# Patient Record
Sex: Male | Born: 1960 | Race: White | Hispanic: No | Marital: Married | Smoking: Never smoker
Health system: Southern US, Community
[De-identification: ages and names within clinical notes are randomized; demographics above are authoritative.]

## PROBLEM LIST (undated history)

## (undated) DIAGNOSIS — N138 Other obstructive and reflux uropathy: Secondary | ICD-10-CM

## (undated) DIAGNOSIS — E291 Testicular hypofunction: Secondary | ICD-10-CM

## (undated) DIAGNOSIS — N200 Calculus of kidney: Secondary | ICD-10-CM

## (undated) DIAGNOSIS — N401 Enlarged prostate with lower urinary tract symptoms: Secondary | ICD-10-CM

## (undated) HISTORY — DX: Other obstructive and reflux uropathy: N13.8

## (undated) HISTORY — DX: Other obstructive and reflux uropathy: N40.1

## (undated) HISTORY — PX: KNEE ARTHROSCOPY: SUR90

## (undated) HISTORY — DX: Testicular hypofunction: E29.1

## (undated) HISTORY — DX: Calculus of kidney: N20.0

---

## 1998-08-22 ENCOUNTER — Emergency Department (HOSPITAL_COMMUNITY): Admission: EM | Admit: 1998-08-22 | Discharge: 1998-08-22 | Payer: Self-pay | Admitting: Emergency Medicine

## 2000-02-18 ENCOUNTER — Encounter (INDEPENDENT_AMBULATORY_CARE_PROVIDER_SITE_OTHER): Payer: Self-pay | Admitting: Specialist

## 2000-02-18 ENCOUNTER — Ambulatory Visit (HOSPITAL_COMMUNITY): Admission: RE | Admit: 2000-02-18 | Discharge: 2000-02-18 | Payer: Self-pay | Admitting: *Deleted

## 2001-02-17 ENCOUNTER — Emergency Department (HOSPITAL_COMMUNITY): Admission: EM | Admit: 2001-02-17 | Discharge: 2001-02-17 | Payer: Self-pay | Admitting: Emergency Medicine

## 2001-02-26 ENCOUNTER — Emergency Department (HOSPITAL_COMMUNITY): Admission: EM | Admit: 2001-02-26 | Discharge: 2001-02-26 | Payer: Self-pay

## 2002-06-24 ENCOUNTER — Emergency Department (HOSPITAL_COMMUNITY): Admission: EM | Admit: 2002-06-24 | Discharge: 2002-06-24 | Payer: Self-pay | Admitting: *Deleted

## 2005-08-29 ENCOUNTER — Emergency Department (HOSPITAL_COMMUNITY): Admission: EM | Admit: 2005-08-29 | Discharge: 2005-08-29 | Payer: Self-pay | Admitting: Emergency Medicine

## 2005-09-08 ENCOUNTER — Ambulatory Visit (HOSPITAL_COMMUNITY): Admission: RE | Admit: 2005-09-08 | Discharge: 2005-09-08 | Payer: Self-pay | Admitting: Urology

## 2008-05-26 ENCOUNTER — Ambulatory Visit (HOSPITAL_COMMUNITY): Admission: RE | Admit: 2008-05-26 | Discharge: 2008-05-26 | Payer: Self-pay | Admitting: Urology

## 2011-09-15 LAB — CBC
MCV: 88.2
Platelets: 345
WBC: 6.4

## 2011-09-15 LAB — PROTIME-INR
INR: 1
Prothrombin Time: 12.9

## 2011-12-20 HISTORY — PX: COLONOSCOPY: SHX174

## 2012-02-20 ENCOUNTER — Encounter: Payer: Self-pay | Admitting: Family Medicine

## 2012-02-20 ENCOUNTER — Ambulatory Visit: Payer: Self-pay | Admitting: Family

## 2012-02-20 ENCOUNTER — Ambulatory Visit (INDEPENDENT_AMBULATORY_CARE_PROVIDER_SITE_OTHER): Payer: BC Managed Care – PPO | Admitting: Family Medicine

## 2012-02-20 VITALS — BP 144/88 | HR 80 | Temp 98.4°F | Wt 215.0 lb

## 2012-02-20 DIAGNOSIS — K5289 Other specified noninfective gastroenteritis and colitis: Secondary | ICD-10-CM

## 2012-02-20 DIAGNOSIS — K529 Noninfective gastroenteritis and colitis, unspecified: Secondary | ICD-10-CM

## 2012-02-20 MED ORDER — SILODOSIN 8 MG PO CAPS: 8.0000 mg | ORAL_CAPSULE | Freq: Every day | ORAL | Status: DC

## 2012-02-20 NOTE — Progress Notes (Signed)
  Subjective:    Patient ID: Calvin Turner, male    DOB: [birth year], 51 y.o.   MRN: [id number]  HPI Here for one week of vomiting and diarrhea. He seems to be over the worst of it now. He is able to eat and drink fluids now. He feels a little weak. No fever.   Review of Systems  Constitutional: Negative.   Respiratory: Negative.   Cardiovascular: Negative.   Gastrointestinal: Positive for vomiting and diarrhea. Negative for abdominal pain.       Objective:   Physical Exam  Constitutional: He appears well-developed and well-nourished.  Abdominal: Soft. Bowel sounds are normal. He exhibits no distension and no mass. There is no tenderness. There is no rebound and no guarding.          Assessment & Plan:  Rest, drink fluids.

## 2012-05-08 ENCOUNTER — Encounter: Payer: Self-pay | Admitting: Family Medicine

## 2012-05-08 ENCOUNTER — Ambulatory Visit (INDEPENDENT_AMBULATORY_CARE_PROVIDER_SITE_OTHER): Payer: BC Managed Care – PPO | Admitting: Family Medicine

## 2012-05-08 VITALS — BP 150/88 | HR 86 | Temp 98.6°F | Wt 222.0 lb

## 2012-05-08 DIAGNOSIS — L308 Other specified dermatitis: Secondary | ICD-10-CM

## 2012-05-08 DIAGNOSIS — L408 Other psoriasis: Secondary | ICD-10-CM

## 2012-05-08 MED ORDER — OMEPRAZOLE MAGNESIUM 20 MG PO TBEC: 20.0000 mg | DELAYED_RELEASE_TABLET | Freq: Every day | ORAL | Status: DC

## 2012-05-08 MED ORDER — HALOBETASOL PROPIONATE 0.05 % EX CREA: TOPICAL_CREAM | Freq: Three times a day (TID) | CUTANEOUS | Status: AC

## 2012-05-08 NOTE — Progress Notes (Signed)
  Subjective:    Patient ID: Calvin Turner, male    DOB: May 04, 1961, 51 y.o.   MRN: [id number]  HPI Here for 4 weeks of itchy spots on his arms and chest. They come and go, and he is not using anything on them.    Review of Systems  Constitutional: Negative.   Skin: Positive for rash.       Objective:   Physical Exam  Constitutional: He appears well-developed and well-nourished.  Skin:       Widespread small red scaly macular spots as above           Assessment & Plan:  Recheck prn. He will set up a cpx soon

## 2012-06-19 ENCOUNTER — Other Ambulatory Visit (INDEPENDENT_AMBULATORY_CARE_PROVIDER_SITE_OTHER): Payer: BC Managed Care – PPO

## 2012-06-19 DIAGNOSIS — Z0000 Encounter for general adult medical examination without abnormal findings: Secondary | ICD-10-CM

## 2012-06-19 LAB — CBC WITH DIFFERENTIAL/PLATELET
Basophils Relative: 0.5 % (ref 0.0–3.0)
Eosinophils Absolute: 0.3 10*3/uL (ref 0.0–0.7)
HCT: 39.3 % (ref 39.0–52.0)
Lymphs Abs: 3 10*3/uL (ref 0.7–4.0)
MCHC: 33.3 g/dL (ref 30.0–36.0)
MCV: 87 fl (ref 78.0–100.0)
Monocytes Absolute: 0.7 10*3/uL (ref 0.1–1.0)
Neutro Abs: 2.5 10*3/uL (ref 1.4–7.7)
Neutrophils Relative %: 37.7 % — ABNORMAL LOW (ref 43.0–77.0)
RBC: 4.52 Mil/uL (ref 4.22–5.81)

## 2012-06-19 LAB — HEPATIC FUNCTION PANEL
ALT: 24 U/L (ref 0–53)
Albumin: 3.8 g/dL (ref 3.5–5.2)
Bilirubin, Direct: 0 mg/dL (ref 0.0–0.3)
Total Protein: 7 g/dL (ref 6.0–8.3)

## 2012-06-19 LAB — LIPID PANEL
Cholesterol: 232 mg/dL — ABNORMAL HIGH (ref 0–200)
Triglycerides: 261 mg/dL — ABNORMAL HIGH (ref 0.0–149.0)

## 2012-06-19 LAB — BASIC METABOLIC PANEL
BUN: 15 mg/dL (ref 6–23)
Glucose, Bld: 98 mg/dL (ref 70–99)
Sodium: 137 mEq/L (ref 135–145)

## 2012-06-19 LAB — POCT URINALYSIS DIPSTICK
Bilirubin, UA: NEGATIVE
Blood, UA: NEGATIVE
Glucose, UA: NEGATIVE
Ketones, UA: NEGATIVE
Spec Grav, UA: 1.025

## 2012-06-19 LAB — LDL CHOLESTEROL, DIRECT: Direct LDL: 140.9 mg/dL

## 2012-06-19 LAB — TSH: TSH: 2.25 u[IU]/mL (ref 0.35–5.50)

## 2012-06-26 ENCOUNTER — Ambulatory Visit (INDEPENDENT_AMBULATORY_CARE_PROVIDER_SITE_OTHER): Payer: BC Managed Care – PPO | Admitting: Family Medicine

## 2012-06-26 ENCOUNTER — Encounter: Payer: Self-pay | Admitting: Family Medicine

## 2012-06-26 VITALS — BP 148/90 | HR 91 | Temp 98.1°F | Ht 74.0 in | Wt 222.0 lb

## 2012-06-26 DIAGNOSIS — Z0000 Encounter for general adult medical examination without abnormal findings: Secondary | ICD-10-CM

## 2012-06-26 MED ORDER — SILODOSIN 8 MG PO CAPS: 8.0000 mg | ORAL_CAPSULE | Freq: Every day | ORAL | Status: DC

## 2012-06-26 MED ORDER — LISINOPRIL 10 MG PO TABS: 10.0000 mg | ORAL_TABLET | Freq: Every day | ORAL | Status: DC

## 2012-06-26 MED ORDER — TEMAZEPAM 30 MG PO CAPS: 30.0000 mg | ORAL_CAPSULE | Freq: Every evening | ORAL | Status: DC | PRN

## 2012-06-26 NOTE — Progress Notes (Signed)
Quick Note:  Pt is here now for CPE and the doctor will discuss. ______

## 2012-06-26 NOTE — Progress Notes (Signed)
  Subjective:    Patient ID: Calvin Turner, male    DOB: March 14, 1961, 51 y.o.   MRN: [id number]  HPI 51 yr old male for a cpx. He feels well except for a few things. He fell off a porch recently and fractured his left ankle. He is wearing a Cam boot and seeing an Orthopedist in [place]. Also he frequently has trouble sleeping. He works third shift and his sleep has been disrupted for years.    Review of Systems  Constitutional: Negative.   HENT: Negative.   Eyes: Negative.   Respiratory: Negative.   Cardiovascular: Negative.   Gastrointestinal: Negative.   Genitourinary: Negative.   Musculoskeletal: Negative.   Skin: Negative.   Neurological: Negative.   Hematological: Negative.   Psychiatric/Behavioral: Positive for disturbed wake/sleep cycle. Negative for hallucinations, behavioral problems, confusion, dysphoric mood, decreased concentration and agitation. The patient is not nervous/anxious and is not hyperactive.        Objective:   Physical Exam  Constitutional: He is oriented to person, place, and time. He appears well-developed and well-nourished. No distress.  HENT:  Head: Normocephalic and atraumatic.  Right Ear: External ear normal.  Left Ear: External ear normal.  Nose: Nose normal.  Mouth/Throat: Oropharynx is clear and moist. No oropharyngeal exudate.  Eyes: Conjunctivae and EOM are normal. Pupils are equal, round, and reactive to light. Right eye exhibits no discharge. Left eye exhibits no discharge. No scleral icterus.  Neck: Neck supple. No JVD present. No tracheal deviation present. No thyromegaly present.  Cardiovascular: Normal rate, regular rhythm, normal heart sounds and intact distal pulses.  Exam reveals no gallop and no friction rub.   No murmur heard.      EKG normal   Pulmonary/Chest: Effort normal and breath sounds normal. No respiratory distress. He has no wheezes. He has no rales. He exhibits no tenderness.  Abdominal: Soft. Bowel sounds are normal.  He exhibits no distension and no mass. There is no tenderness. There is no rebound and no guarding.  Genitourinary: Rectum normal, prostate normal and penis normal. Guaiac negative stool. No penile tenderness.  Musculoskeletal: Normal range of motion. He exhibits no edema and no tenderness.  Lymphadenopathy:    He has no cervical adenopathy.  Neurological: He is alert and oriented to person, place, and time. He has normal reflexes. No cranial nerve deficit. He exhibits normal muscle tone. Coordination normal.  Skin: Skin is warm and dry. No rash noted. He is not diaphoretic. No erythema. No pallor.  Psychiatric: He has a normal mood and affect. His behavior is normal. Judgment and thought content normal.          Assessment & Plan:  Well exam. He needs to lose some weight. Start on Temazepam for sleep. Try Lisinopril for the HTN. Recheck in one month. He needs a colonoscopy, but he wants to check with his insurance company before we set this up.

## 2012-12-17 ENCOUNTER — Other Ambulatory Visit: Payer: Self-pay | Admitting: Family Medicine

## 2012-12-21 ENCOUNTER — Telehealth: Payer: Self-pay | Admitting: Family Medicine

## 2012-12-21 NOTE — Telephone Encounter (Signed)
Refill request for Temazepam 30 mg take 1 po qhs prn and last here on 06/26/12.

## 2012-12-21 NOTE — Telephone Encounter (Signed)
Call in #30 with 5 rf 

## 2012-12-24 ENCOUNTER — Other Ambulatory Visit: Payer: Self-pay | Admitting: Family Medicine

## 2012-12-24 NOTE — Telephone Encounter (Signed)
I refilled this on 12-17-12

## 2012-12-25 NOTE — Telephone Encounter (Signed)
This was called in

## 2013-05-19 ENCOUNTER — Other Ambulatory Visit: Payer: Self-pay | Admitting: Family Medicine

## 2013-06-19 ENCOUNTER — Other Ambulatory Visit: Payer: Self-pay | Admitting: Family Medicine

## 2013-06-23 ENCOUNTER — Other Ambulatory Visit: Payer: Self-pay | Admitting: Family Medicine

## 2013-06-26 NOTE — Telephone Encounter (Signed)
Call in #30 with 5 rf 

## 2013-08-30 ENCOUNTER — Ambulatory Visit (INDEPENDENT_AMBULATORY_CARE_PROVIDER_SITE_OTHER): Payer: BC Managed Care – PPO | Admitting: Family Medicine

## 2013-08-30 ENCOUNTER — Encounter: Payer: Self-pay | Admitting: Family Medicine

## 2013-08-30 VITALS — BP 128/76 | HR 73 | Temp 98.3°F | Wt 212.0 lb

## 2013-08-30 DIAGNOSIS — N200 Calculus of kidney: Secondary | ICD-10-CM

## 2013-08-30 DIAGNOSIS — Z23 Encounter for immunization: Secondary | ICD-10-CM

## 2013-08-30 DIAGNOSIS — E291 Testicular hypofunction: Secondary | ICD-10-CM

## 2013-08-30 DIAGNOSIS — I10 Essential (primary) hypertension: Secondary | ICD-10-CM

## 2013-08-30 MED ORDER — LISINOPRIL 10 MG PO TABS: ORAL_TABLET | ORAL | Status: DC

## 2013-08-30 NOTE — Progress Notes (Signed)
  Subjective:    Patient ID: Calvin Turner, male    DOB: [birth year], 52 y.o.   MRN: [id number]  HPI Here for follow up. He feels well. His BP is stable.    Review of Systems  Constitutional: Negative.   Respiratory: Negative.   Cardiovascular: Negative.        Objective:   Physical Exam  Constitutional: He appears well-developed and well-nourished.  Cardiovascular: Normal rate, regular rhythm, normal heart sounds and intact distal pulses.   Pulmonary/Chest: Effort normal and breath sounds normal.          Assessment & Plan:  He is doing well. Return soon for fasting labs

## 2013-12-29 ENCOUNTER — Other Ambulatory Visit: Payer: Self-pay | Admitting: Family Medicine

## 2013-12-31 NOTE — Telephone Encounter (Signed)
Call in #30 with 5 rf 

## 2014-01-10 ENCOUNTER — Ambulatory Visit (INDEPENDENT_AMBULATORY_CARE_PROVIDER_SITE_OTHER): Payer: BC Managed Care – PPO | Admitting: Family Medicine

## 2014-01-10 ENCOUNTER — Encounter: Payer: Self-pay | Admitting: Family Medicine

## 2014-01-10 VITALS — BP 124/72 | HR 84 | Temp 98.1°F | Ht <= 58 in | Wt 213.0 lb

## 2014-01-10 DIAGNOSIS — N139 Obstructive and reflux uropathy, unspecified: Secondary | ICD-10-CM

## 2014-01-10 DIAGNOSIS — M1990 Unspecified osteoarthritis, unspecified site: Secondary | ICD-10-CM

## 2014-01-10 DIAGNOSIS — I10 Essential (primary) hypertension: Secondary | ICD-10-CM

## 2014-01-10 DIAGNOSIS — N401 Enlarged prostate with lower urinary tract symptoms: Secondary | ICD-10-CM

## 2014-01-10 DIAGNOSIS — N138 Other obstructive and reflux uropathy: Secondary | ICD-10-CM

## 2014-01-10 DIAGNOSIS — G4700 Insomnia, unspecified: Secondary | ICD-10-CM

## 2014-01-10 LAB — LIPID PANEL
CHOL/HDL RATIO: 4
Cholesterol: 192 mg/dL (ref 0–200)
HDL: 49.1 mg/dL (ref >39.00)
LDL Cholesterol: 121 mg/dL — ABNORMAL HIGH (ref 0–99)
Triglycerides: 108 mg/dL (ref 0.0–149.0)
VLDL: 21.6 mg/dL (ref 0.0–40.0)

## 2014-01-10 LAB — BASIC METABOLIC PANEL
BUN: 12 mg/dL (ref 6–23)
CALCIUM: 9.2 mg/dL (ref 8.4–10.5)
CHLORIDE: 104 meq/L (ref 96–112)
CO2: 28 meq/L (ref 19–32)
Creatinine, Ser: 1 mg/dL (ref 0.4–1.5)
GFR: 87.35 mL/min (ref >60.00)
Glucose, Bld: 81 mg/dL (ref 70–99)
Potassium: 4.2 mEq/L (ref 3.5–5.1)
SODIUM: 140 meq/L (ref 135–145)

## 2014-01-10 LAB — POCT URINALYSIS DIPSTICK
BILIRUBIN UA: NEGATIVE
GLUCOSE UA: NEGATIVE
Ketones, UA: NEGATIVE
Leukocytes, UA: NEGATIVE
Nitrite, UA: NEGATIVE
Protein, UA: NEGATIVE
RBC UA: NEGATIVE
SPEC GRAV UA: 1.015
UROBILINOGEN UA: 0.2
pH, UA: 6

## 2014-01-10 LAB — CBC WITH DIFFERENTIAL/PLATELET
BASOS PCT: 0.4 % (ref 0.0–3.0)
Basophils Absolute: 0 10*3/uL (ref 0.0–0.1)
EOS PCT: 3.2 % (ref 0.0–5.0)
Eosinophils Absolute: 0.2 10*3/uL (ref 0.0–0.7)
HEMATOCRIT: 41 % (ref 39.0–52.0)
HEMOGLOBIN: 13.8 g/dL (ref 13.0–17.0)
LYMPHS ABS: 2.6 10*3/uL (ref 0.7–4.0)
Lymphocytes Relative: 43 % (ref 12.0–46.0)
MCHC: 33.6 g/dL (ref 30.0–36.0)
MCV: 88.1 fl (ref 78.0–100.0)
MONO ABS: 0.7 10*3/uL (ref 0.1–1.0)
Monocytes Relative: 11.3 % (ref 3.0–12.0)
NEUTROS ABS: 2.5 10*3/uL (ref 1.4–7.7)
Neutrophils Relative %: 42.1 % — ABNORMAL LOW (ref 43.0–77.0)
Platelets: 250 10*3/uL (ref 150.0–400.0)
RBC: 4.65 Mil/uL (ref 4.22–5.81)
RDW: 13.7 % (ref 11.5–14.6)
WBC: 6 10*3/uL (ref 4.5–10.5)

## 2014-01-10 LAB — PSA: PSA: 0.67 ng/mL (ref 0.10–4.00)

## 2014-01-10 LAB — TSH: TSH: 0.94 u[IU]/mL (ref 0.35–5.50)

## 2014-01-10 LAB — HEPATIC FUNCTION PANEL
ALK PHOS: 53 U/L (ref 39–117)
ALT: 24 U/L (ref 0–53)
AST: 17 U/L (ref 0–37)
Albumin: 4 g/dL (ref 3.5–5.2)
BILIRUBIN DIRECT: 0 mg/dL (ref 0.0–0.3)
TOTAL PROTEIN: 6.9 g/dL (ref 6.0–8.3)
Total Bilirubin: 0.4 mg/dL (ref 0.3–1.2)

## 2014-01-10 MED ORDER — DICLOFENAC SODIUM 75 MG PO TBEC: 75.0000 mg | DELAYED_RELEASE_TABLET | Freq: Two times a day (BID) | ORAL | Status: DC

## 2014-01-10 NOTE — Addendum Note (Signed)
Addended by: Gershon CraneFRY, Diandre Merica A on: 01/10/2014 09:57 AM   Modules accepted: Orders

## 2014-01-10 NOTE — Progress Notes (Signed)
   Subjective:    Patient ID: Calvin Turner, male    DOB: 22-Jul-1961, 53 y.o.   MRN: [id number]  HPI Doing well in general except for pain in both knees and both feet. He is on his feet all day at work. 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    Review of Systems  Constitutional: Negative.   Respiratory: Negative.   Cardiovascular: Negative.        Objective:   Physical Exam  Constitutional: He appears well-developed and well-nourished.  Cardiovascular: Normal rate, regular rhythm, normal heart sounds and intact distal pulses.   Pulmonary/Chest: Effort normal and breath sounds normal.          Assessment & Plan:  Try Diclofenac prn.

## 2014-01-10 NOTE — Progress Notes (Signed)
Pre visit review using our clinic review tool, if applicable. No additional management support is needed unless otherwise documented below in the visit note. 

## 2014-01-13 ENCOUNTER — Telehealth: Payer: Self-pay | Admitting: Family Medicine

## 2014-01-13 NOTE — Telephone Encounter (Signed)
Relevant patient education assigned to patient using Emmi. ° °

## 2014-01-27 ENCOUNTER — Encounter: Payer: Self-pay | Admitting: Family Medicine

## 2014-01-28 NOTE — Telephone Encounter (Signed)
NO that needs to be supervised by his Urologist. He should ask them to prescribe vials of this for him to give himself shots at home

## 2014-02-06 ENCOUNTER — Encounter: Payer: Self-pay | Admitting: Family Medicine

## 2014-03-31 ENCOUNTER — Other Ambulatory Visit (HOSPITAL_COMMUNITY): Payer: Self-pay | Admitting: Physical Medicine and Rehabilitation

## 2014-03-31 ENCOUNTER — Ambulatory Visit (HOSPITAL_COMMUNITY)
Admission: RE | Admit: 2014-03-31 | Discharge: 2014-03-31 | Disposition: A | Discharge status: Home / Self Care | Payer: BC Managed Care – PPO | Source: Ambulatory Visit | Attending: Physical Medicine and Rehabilitation | Admitting: Physical Medicine and Rehabilitation

## 2014-03-31 DIAGNOSIS — Z01818 Encounter for other preprocedural examination: Secondary | ICD-10-CM | POA: Insufficient documentation

## 2014-03-31 DIAGNOSIS — M549 Dorsalgia, unspecified: Secondary | ICD-10-CM

## 2014-03-31 DIAGNOSIS — Z135 Encounter for screening for eye and ear disorders: Secondary | ICD-10-CM | POA: Insufficient documentation

## 2014-06-27 ENCOUNTER — Other Ambulatory Visit: Payer: Self-pay | Admitting: Family Medicine

## 2014-07-01 NOTE — Telephone Encounter (Signed)
Call in Omeprazole #30 with 11 rf, and Temazepam #30 with 5 rf

## 2014-08-13 ENCOUNTER — Ambulatory Visit: Payer: BC Managed Care – PPO | Admitting: Family Medicine

## 2014-08-18 ENCOUNTER — Ambulatory Visit: Payer: BC Managed Care – PPO | Admitting: Family Medicine

## 2014-08-19 ENCOUNTER — Ambulatory Visit: Payer: BC Managed Care – PPO | Admitting: Family Medicine

## 2014-08-27 ENCOUNTER — Ambulatory Visit (INDEPENDENT_AMBULATORY_CARE_PROVIDER_SITE_OTHER): Payer: BC Managed Care – PPO | Admitting: Family Medicine

## 2014-08-27 ENCOUNTER — Encounter: Payer: Self-pay | Admitting: Family Medicine

## 2014-08-27 VITALS — BP 129/70 | HR 69 | Temp 98.4°F | Ht 73.0 in | Wt 211.0 lb

## 2014-08-27 DIAGNOSIS — G4700 Insomnia, unspecified: Secondary | ICD-10-CM

## 2014-08-27 DIAGNOSIS — I10 Essential (primary) hypertension: Secondary | ICD-10-CM

## 2014-08-27 DIAGNOSIS — M150 Primary generalized (osteo)arthritis: Secondary | ICD-10-CM

## 2014-08-27 DIAGNOSIS — K219 Gastro-esophageal reflux disease without esophagitis: Secondary | ICD-10-CM

## 2014-08-27 DIAGNOSIS — M159 Polyosteoarthritis, unspecified: Secondary | ICD-10-CM

## 2014-08-27 DIAGNOSIS — Z23 Encounter for immunization: Secondary | ICD-10-CM

## 2014-08-27 MED ORDER — OMEPRAZOLE 20 MG PO CPDR: 20.0000 mg | DELAYED_RELEASE_CAPSULE | Freq: Two times a day (BID) | ORAL | Status: DC

## 2014-08-27 MED ORDER — LISINOPRIL 10 MG PO TABS: ORAL_TABLET | ORAL | Status: DC

## 2014-08-27 NOTE — Addendum Note (Signed)
Addended by: Aniceto Boss A on: 08/27/2014 11:01 AM   Modules accepted: Orders

## 2014-08-27 NOTE — Progress Notes (Signed)
Pre visit review using our clinic review tool, if applicable. No additional management support is needed unless otherwise documented below in the visit note. 

## 2014-08-27 NOTE — Progress Notes (Signed)
   Subjective:    Patient ID: Calvin Turner, male    DOB: 1961-03-17, 53 y.o.   MRN: [id number]  HPI Here to follow up. He feels great although his GERD is acting up. He asks if he can take Omeprazole bid. He is recovering well from cervical spine surgery. His BP is stable.    Review of Systems  Constitutional: Negative.   Respiratory: Negative.   Cardiovascular: Negative.        Objective:   Physical Exam  Constitutional: He appears well-developed and well-nourished.  Cardiovascular: Normal rate, regular rhythm, normal heart sounds and intact distal pulses.   Pulmonary/Chest: Effort normal and breath sounds normal.  Abdominal: Soft. Bowel sounds are normal. He exhibits no distension and no mass. There is no tenderness. There is no rebound and no guarding.          Assessment & Plan:  Increase Omeprazole to 20 mg bid. HTN is stable.

## 2014-09-10 IMAGING — CR DG ORBITS FOR FOREIGN BODY
2 series · 2 of 2 positions shown · non-contrast
Comparison: None.

CLINICAL DATA: Metal working/exposure; clearance prior to MRI
52-year-old male.

EXAM:
ORBITS FOR FOREIGN BODY - 2 VIEW

[w waters (1 of 2)]
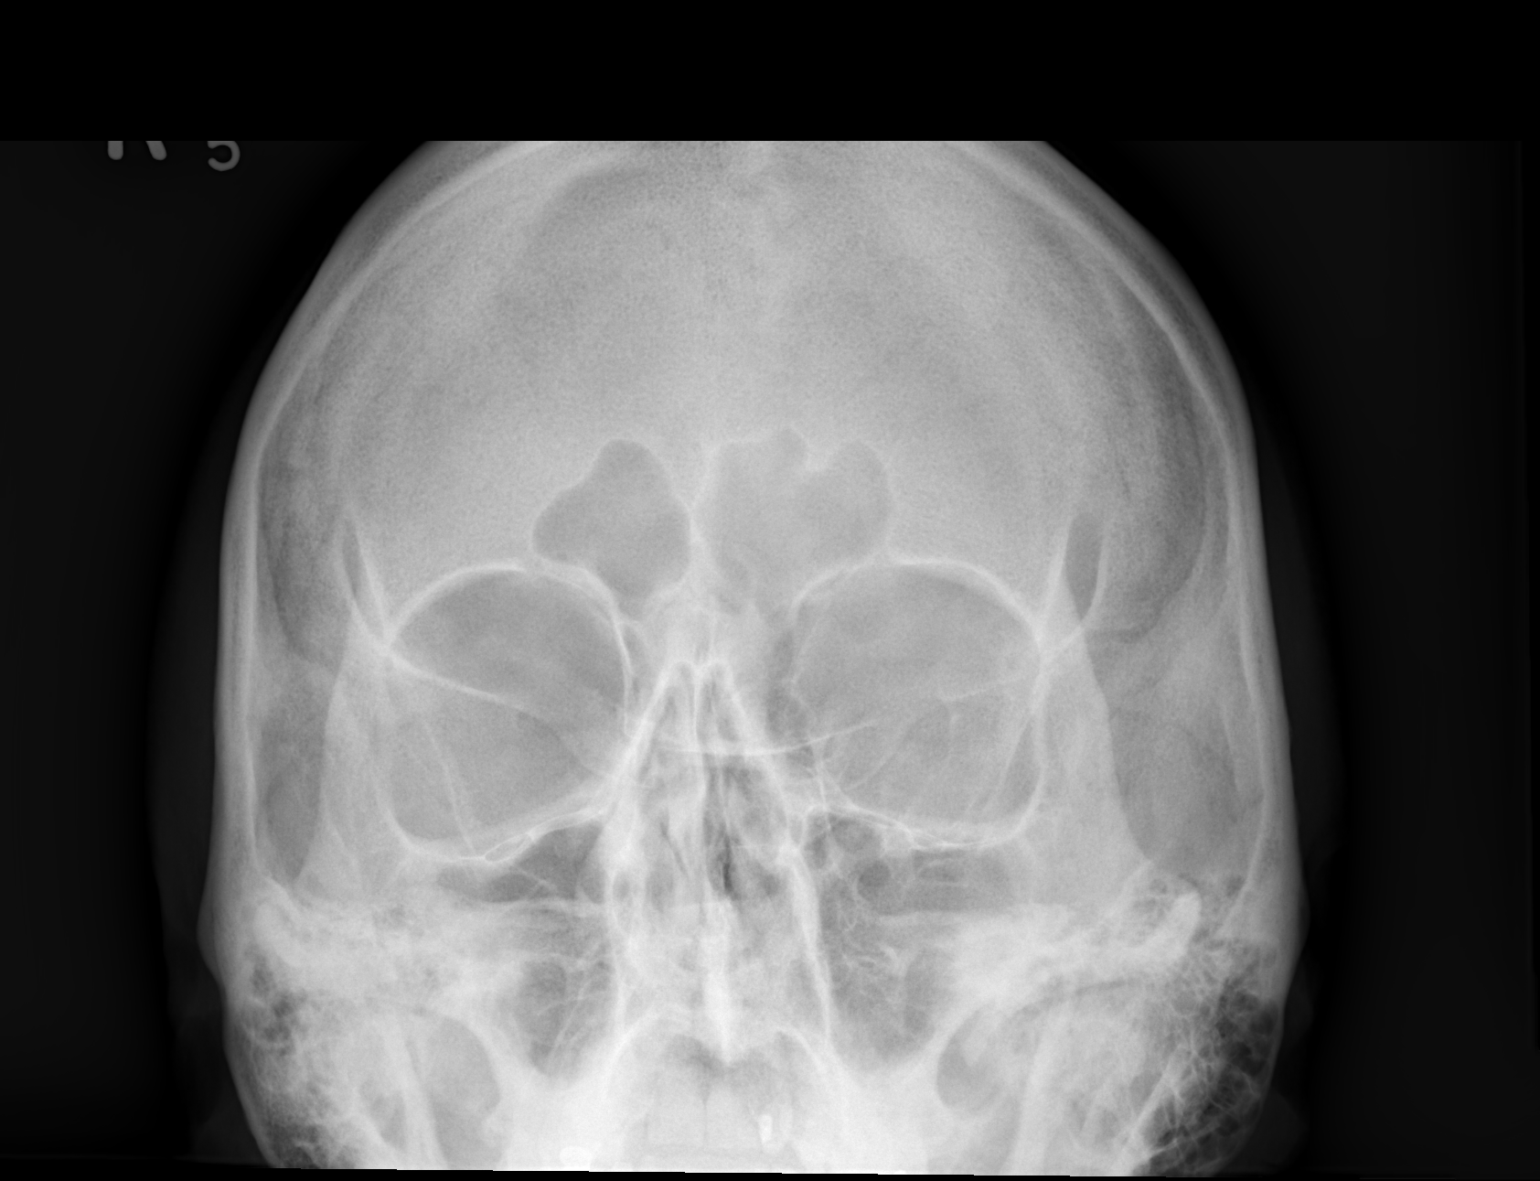

[w waters (2 of 2)]
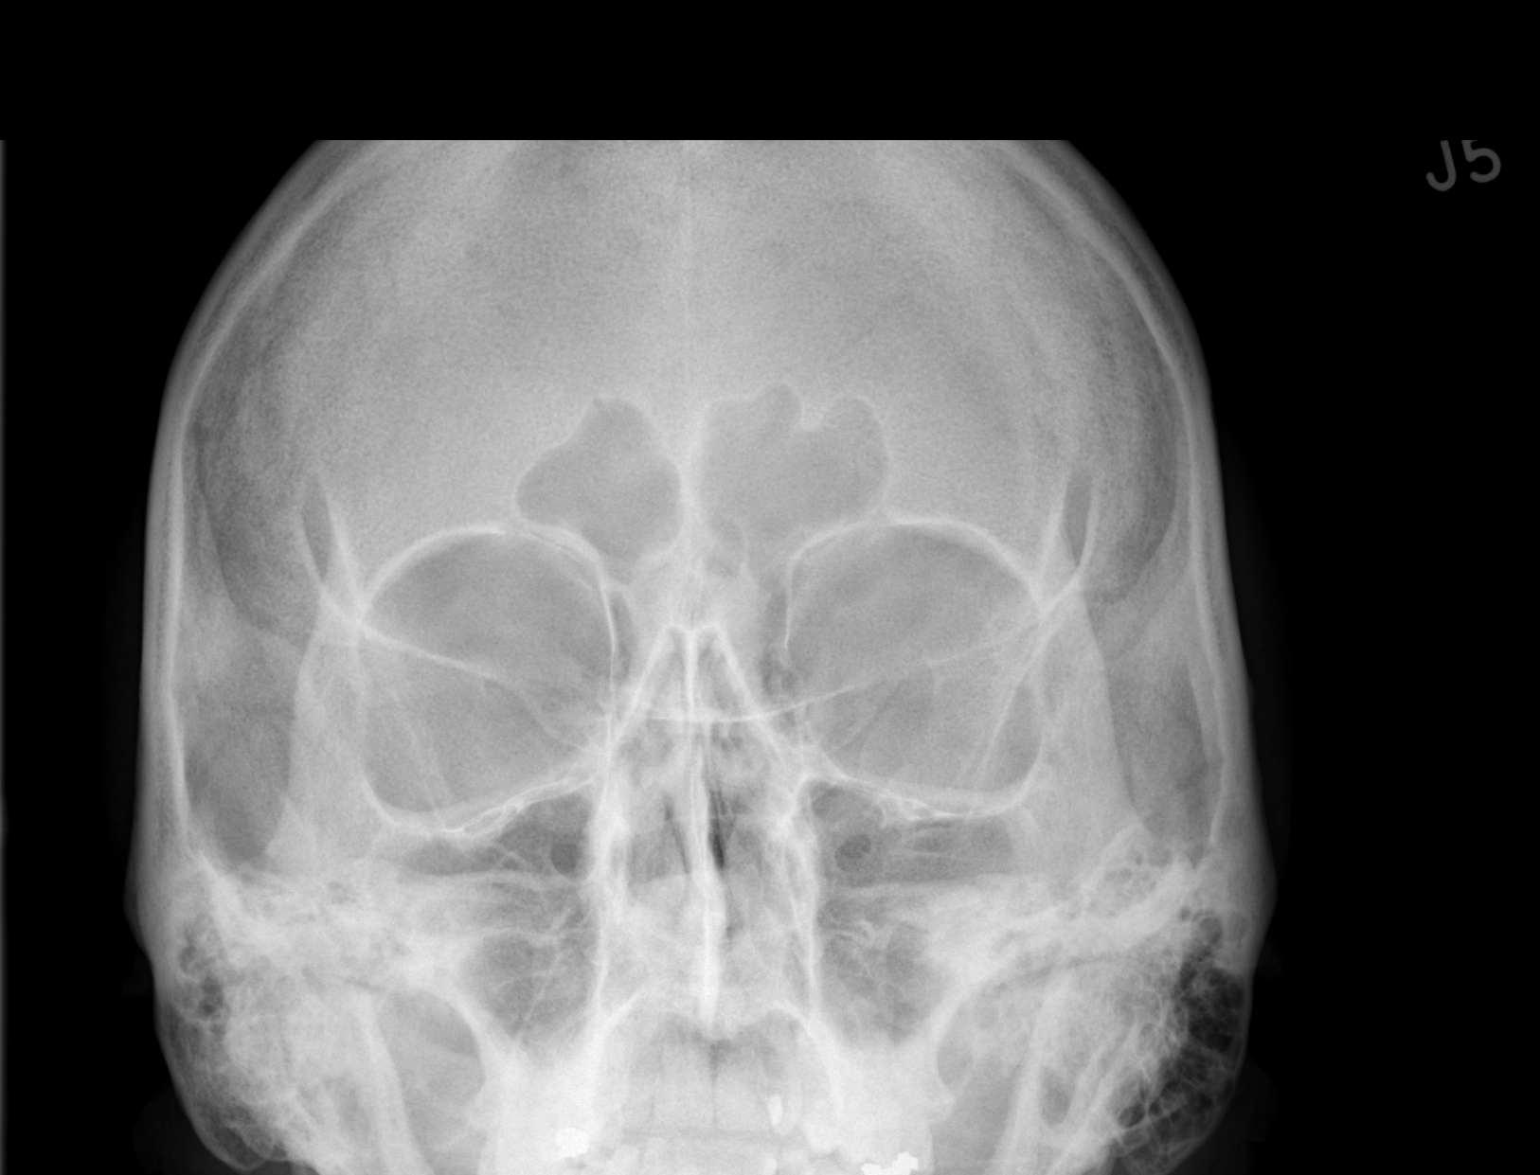

[2 of 2 positions shown; findings below may reference images not displayed]

FINDINGS: There is no evidence of metallic foreign body within the orbits. No
significant bone abnormality identified.
IMPRESSION: No evidence of metallic foreign body within the orbits.

## 2014-09-14 ENCOUNTER — Encounter: Payer: Self-pay | Admitting: Family Medicine

## 2014-12-28 ENCOUNTER — Other Ambulatory Visit: Payer: Self-pay | Admitting: Family Medicine

## 2015-01-07 ENCOUNTER — Telehealth: Payer: Self-pay | Admitting: Family Medicine

## 2015-01-07 NOTE — Telephone Encounter (Signed)
Patient is requesting re-fill on temazepam (RESTORIL) 30 MG capsule  [id number] [place], [place] - [address]

## 2015-01-07 NOTE — Telephone Encounter (Signed)
Call in #30 with 5 rf 

## 2015-01-08 MED ORDER — TEMAZEPAM 30 MG PO CAPS: 30.0000 mg | ORAL_CAPSULE | Freq: Every day | ORAL | Status: DC

## 2015-01-08 NOTE — Telephone Encounter (Signed)
I called in script 

## 2015-01-13 ENCOUNTER — Encounter: Payer: Self-pay | Admitting: Family Medicine

## 2015-01-13 ENCOUNTER — Ambulatory Visit (INDEPENDENT_AMBULATORY_CARE_PROVIDER_SITE_OTHER): Payer: 59 | Admitting: Family Medicine

## 2015-01-13 VITALS — BP 111/70 | HR 83 | Temp 98.1°F | Ht 73.0 in | Wt 216.0 lb

## 2015-01-13 DIAGNOSIS — L03211 Cellulitis of face: Secondary | ICD-10-CM

## 2015-01-13 DIAGNOSIS — I10 Essential (primary) hypertension: Secondary | ICD-10-CM

## 2015-01-13 DIAGNOSIS — L0201 Cutaneous abscess of face: Secondary | ICD-10-CM | POA: Insufficient documentation

## 2015-01-13 LAB — POCT URINALYSIS DIPSTICK
BILIRUBIN UA: NEGATIVE
Blood, UA: NEGATIVE
GLUCOSE UA: NEGATIVE
Ketones, UA: NEGATIVE
LEUKOCYTES UA: NEGATIVE
Nitrite, UA: NEGATIVE
Protein, UA: NEGATIVE
SPEC GRAV UA: 1.01
Urobilinogen, UA: 0.2
pH, UA: 6.5

## 2015-01-13 LAB — CBC WITH DIFFERENTIAL/PLATELET
BASOS ABS: 0 10*3/uL (ref 0.0–0.1)
BASOS PCT: 0.7 % (ref 0.0–3.0)
EOS ABS: 0.5 10*3/uL (ref 0.0–0.7)
Eosinophils Relative: 8 % — ABNORMAL HIGH (ref 0.0–5.0)
HCT: 38.3 % — ABNORMAL LOW (ref 39.0–52.0)
HEMOGLOBIN: 13 g/dL (ref 13.0–17.0)
LYMPHS ABS: 1.8 10*3/uL (ref 0.7–4.0)
Lymphocytes Relative: 32.5 % (ref 12.0–46.0)
MCHC: 34 g/dL (ref 30.0–36.0)
MCV: 86.4 fl (ref 78.0–100.0)
Monocytes Absolute: 0.7 10*3/uL (ref 0.1–1.0)
Monocytes Relative: 13.2 % — ABNORMAL HIGH (ref 3.0–12.0)
Neutro Abs: 2.6 10*3/uL (ref 1.4–7.7)
Neutrophils Relative %: 45.6 % (ref 43.0–77.0)
Platelets: 268 10*3/uL (ref 150.0–400.0)
RBC: 4.43 Mil/uL (ref 4.22–5.81)
RDW: 12.9 % (ref 11.5–15.5)
WBC: 5.6 10*3/uL (ref 4.0–10.5)

## 2015-01-13 LAB — TSH: TSH: 1.29 u[IU]/mL (ref 0.35–4.50)

## 2015-01-13 MED ORDER — MUPIROCIN 2 % EX OINT: 1.0000 "application " | TOPICAL_OINTMENT | Freq: Three times a day (TID) | CUTANEOUS | Status: AC

## 2015-01-13 NOTE — Progress Notes (Signed)
   Subjective:    Patient ID: Calvin Turner, male    DOB: 15-Jun-1961, 54 y.o.   MRN: [id number]  HPI Here to follow up. He feels well and his BP is stable. For the past 2 months he has had sores in both nostrils and the tip of his nose is often red and swollen.    Review of Systems  Constitutional: Negative.   Respiratory: Negative.   Cardiovascular: Negative.   Skin: Positive for rash.       Objective:   Physical Exam  Constitutional: He appears well-developed and well-nourished.  Cardiovascular: Normal rate, regular rhythm, normal heart sounds and intact distal pulses.   Pulmonary/Chest: Effort normal and breath sounds normal.  Skin:  The tip of the nose is red, warm, and tender           Assessment & Plan:  His HTN is stable. He has a staph infection on the nose , so use Bactroban tid prn. Get labs.

## 2015-01-13 NOTE — Progress Notes (Signed)
Pre visit review using our clinic review tool, if applicable. No additional management support is needed unless otherwise documented below in the visit note. 

## 2015-01-14 LAB — BASIC METABOLIC PANEL
BUN: 16 mg/dL (ref 6–23)
CO2: 29 mEq/L (ref 19–32)
Calcium: 9.2 mg/dL (ref 8.4–10.5)
Chloride: 102 mEq/L (ref 96–112)
Creatinine, Ser: 0.9 mg/dL (ref 0.40–1.50)
GFR: 93.74 mL/min (ref >60.00)
GLUCOSE: 96 mg/dL (ref 70–99)
POTASSIUM: 4.3 meq/L (ref 3.5–5.1)
Sodium: 137 mEq/L (ref 135–145)

## 2015-01-14 LAB — HEPATIC FUNCTION PANEL
ALT: 42 U/L (ref 0–53)
AST: 23 U/L (ref 0–37)
Albumin: 4.1 g/dL (ref 3.5–5.2)
Alkaline Phosphatase: 58 U/L (ref 39–117)
BILIRUBIN TOTAL: 0.4 mg/dL (ref 0.2–1.2)
Bilirubin, Direct: 0.1 mg/dL (ref 0.0–0.3)
Total Protein: 7.2 g/dL (ref 6.0–8.3)

## 2015-01-14 LAB — LDL CHOLESTEROL, DIRECT: Direct LDL: 157 mg/dL

## 2015-01-14 LAB — LIPID PANEL
Cholesterol: 267 mg/dL — ABNORMAL HIGH (ref 0–200)
HDL: 42.5 mg/dL (ref >39.00)
NonHDL: 224.5
Total CHOL/HDL Ratio: 6
Triglycerides: 347 mg/dL — ABNORMAL HIGH (ref 0.0–149.0)
VLDL: 69.4 mg/dL — AB (ref 0.0–40.0)

## 2015-01-19 MED ORDER — ATORVASTATIN CALCIUM 20 MG PO TABS: 20.0000 mg | ORAL_TABLET | Freq: Every day | ORAL | Status: AC

## 2015-01-19 NOTE — Addendum Note (Signed)
Addended by: Aniceto BossNIMMONS, Angelise Petrich A on: 01/19/2015 12:29 PM   Modules accepted: Orders

## 2015-02-25 ENCOUNTER — Ambulatory Visit (INDEPENDENT_AMBULATORY_CARE_PROVIDER_SITE_OTHER): Payer: 59 | Admitting: Family Medicine

## 2015-02-25 ENCOUNTER — Encounter: Payer: Self-pay | Admitting: Family Medicine

## 2015-02-25 VITALS — BP 100/60 | HR 101 | Temp 98.3°F | Wt 211.7 lb

## 2015-02-25 DIAGNOSIS — M542 Cervicalgia: Secondary | ICD-10-CM

## 2015-02-25 DIAGNOSIS — I10 Essential (primary) hypertension: Secondary | ICD-10-CM

## 2015-02-25 MED ORDER — DICLOFENAC SODIUM 75 MG PO TBEC: DELAYED_RELEASE_TABLET | ORAL | Status: DC

## 2015-02-25 MED ORDER — GABAPENTIN 300 MG PO CAPS: 300.0000 mg | ORAL_CAPSULE | Freq: Four times a day (QID) | ORAL | Status: AC

## 2015-02-25 MED ORDER — BACLOFEN 20 MG PO TABS: 20.0000 mg | ORAL_TABLET | Freq: Three times a day (TID) | ORAL | Status: DC

## 2015-02-25 MED ORDER — OXYCODONE HCL ER 20 MG PO T12A: 20.0000 mg | EXTENDED_RELEASE_TABLET | Freq: Three times a day (TID) | ORAL | Status: DC | PRN

## 2015-02-25 NOTE — Progress Notes (Signed)
Pre visit review using our clinic review tool, if applicable. No additional management support is needed unless otherwise documented below in the visit note. 

## 2015-02-25 NOTE — Progress Notes (Signed)
   Subjective:    Patient ID: Calvin Turner, male    DOB: Aug 02, 1961, 54 y.o.   MRN: [id number]  HPI Here for follow up. He is doing well except for chronic neck and back pain. He has been released by the neurosurgeons and has been turned back over to us to care for. 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 His BP is stable.    Review of Systems  Constitutional: Negative.   Respiratory: Negative.   Cardiovascular: Negative.   Musculoskeletal: Positive for neck pain.       Objective:   Physical Exam  Constitutional: He appears well-developed and well-nourished.  Cardiovascular: Normal rate, regular rhythm, normal heart sounds and intact distal pulses.   Pulmonary/Chest: Effort normal and breath sounds normal.          Assessment & Plan:  We will stop the short acting hydrocodone and the short acting oxycodone and the Tramadol, and instead will use only Oxycodone for pain management. Start with 20 mg TID. His HTN is stable.

## 2015-02-27 ENCOUNTER — Telehealth: Payer: Self-pay | Admitting: Family Medicine

## 2015-02-27 NOTE — Telephone Encounter (Signed)
PA has been submitted and is pending clinical review.  [id number]

## 2015-02-27 NOTE — Telephone Encounter (Signed)
I received a PA request for Oxycontin and in the patient's medication allergies Oxycodone and Codeine are listed.  Do you still want me to continue with the PA request?

## 2015-02-27 NOTE — Telephone Encounter (Signed)
Dr. Clent RidgesFry removed Oxycodone from allergy list, it was not a true allergy. Please proceed with prior.

## 2015-03-02 ENCOUNTER — Telehealth: Payer: Self-pay | Admitting: Family Medicine

## 2015-03-02 NOTE — Telephone Encounter (Signed)
PA was denied

## 2015-03-02 NOTE — Telephone Encounter (Signed)
PA for Oxycontin was denied.  Patient must try and fail OR has a history of failure or contraindication to at least 3 of the following:  Nucynta ER, Opana ER, Morphine sulfate controlled release tablets, or Fentanyl transdermal.

## 2015-03-04 NOTE — Telephone Encounter (Signed)
Patient called back to check the status of his medication and I advised him of the denial.  He is aware that Dr. Clent RidgesFry has to review the note and advise of an alternative.  He is awaiting a callback from [place].

## 2015-03-05 MED ORDER — OXYMORPHONE HCL ER 20 MG PO TB12: 20.0000 mg | ORAL_TABLET | Freq: Two times a day (BID) | ORAL | Status: DC

## 2015-03-05 NOTE — Telephone Encounter (Signed)
Script is ready for pick up and I left a voice message.  

## 2015-03-05 NOTE — Telephone Encounter (Signed)
I wrote for Opana ER 20 mg bid instead

## 2015-03-10 ENCOUNTER — Ambulatory Visit (INDEPENDENT_AMBULATORY_CARE_PROVIDER_SITE_OTHER): Payer: 59 | Admitting: Family Medicine

## 2015-03-10 ENCOUNTER — Encounter: Payer: Self-pay | Admitting: Family Medicine

## 2015-03-10 VITALS — BP 113/69 | HR 110 | Temp 98.3°F | Ht 73.0 in | Wt 206.0 lb

## 2015-03-10 DIAGNOSIS — J209 Acute bronchitis, unspecified: Secondary | ICD-10-CM

## 2015-03-10 MED ORDER — AMOXICILLIN-POT CLAVULANATE 875-125 MG PO TABS: 1.0000 | ORAL_TABLET | Freq: Two times a day (BID) | ORAL | Status: AC

## 2015-03-10 MED ORDER — HYDROCODONE-HOMATROPINE 5-1.5 MG/5ML PO SYRP: 5.0000 mL | ORAL_SOLUTION | ORAL | Status: AC | PRN

## 2015-03-10 NOTE — Progress Notes (Signed)
Pre visit review using our clinic review tool, if applicable. No additional management support is needed unless otherwise documented below in the visit note. 

## 2015-03-10 NOTE — Progress Notes (Signed)
   Subjective:    Patient ID: Calvin Turner, male    DOB: 08/28/1961, 54 y.o.   MRN: [id number]  HPI Here for one week of chest congestion and coughing up green sputum. No fever.    Review of Systems  Constitutional: Negative.   HENT: Positive for congestion, postnasal drip and sinus pressure.   Eyes: Negative.   Respiratory: Positive for cough and chest tightness.        Objective:   Physical Exam  Constitutional: He appears well-developed and well-nourished.  HENT:  Right Ear: External ear normal.  Left Ear: External ear normal.  Nose: Nose normal.  Mouth/Throat: Oropharynx is clear and moist.  Eyes: Conjunctivae are normal.  Pulmonary/Chest: Effort normal. No respiratory distress. He has no wheezes. He has no rales.  Scattered rhonchi  Lymphadenopathy:    He has no cervical adenopathy.          Assessment & Plan:  Add Mucinex

## 2015-03-11 ENCOUNTER — Other Ambulatory Visit: Payer: Self-pay | Admitting: Family Medicine

## 2015-03-11 NOTE — Telephone Encounter (Signed)
I received a PA request for oxymorphone.  The plan prefers BRAND Opana ER.  Can you please change the rx to DAW BRAND Opana ER 20 mg?  [facility] [place]. I already submitted the PA for [facility]BRAND and it has been approved.

## 2015-03-12 MED ORDER — OPANA ER 20 MG PO TB12: 20.0000 mg | ORAL_TABLET | Freq: Two times a day (BID) | ORAL | Status: DC

## 2015-03-12 NOTE — Telephone Encounter (Signed)
Called and left a message for pt that rx is ready for pickup and that the office is closed on Good Friday.

## 2015-03-12 NOTE — Telephone Encounter (Signed)
Rx reprinted for Opana ER 20 mg DAW and given to Dr. Clent RidgesFry to sign.

## 2015-04-12 ENCOUNTER — Telehealth: Payer: Self-pay | Admitting: Family Medicine

## 2015-04-13 MED ORDER — OXYMORPHONE HCL ER 20 MG PO TB12: 20.0000 mg | ORAL_TABLET | Freq: Two times a day (BID) | ORAL | Status: DC

## 2015-04-13 NOTE — Telephone Encounter (Signed)
done

## 2015-04-13 NOTE — Addendum Note (Signed)
Addended by: Gershon CraneFRY, Alizia Greif A on: 04/13/2015 04:55 PM   Modules accepted: Orders

## 2015-04-14 NOTE — Telephone Encounter (Signed)
Left a message that prescription is ready for pick up.  

## 2015-05-05 ENCOUNTER — Ambulatory Visit (INDEPENDENT_AMBULATORY_CARE_PROVIDER_SITE_OTHER): Payer: 59 | Admitting: Family Medicine

## 2015-05-05 ENCOUNTER — Encounter: Payer: Self-pay | Admitting: Family Medicine

## 2015-05-05 VITALS — BP 105/62 | HR 65 | Temp 98.6°F | Ht 73.0 in | Wt 211.0 lb

## 2015-05-05 DIAGNOSIS — M542 Cervicalgia: Secondary | ICD-10-CM | POA: Diagnosis not present

## 2015-05-05 DIAGNOSIS — G4700 Insomnia, unspecified: Secondary | ICD-10-CM | POA: Diagnosis not present

## 2015-05-05 DIAGNOSIS — I10 Essential (primary) hypertension: Secondary | ICD-10-CM

## 2015-05-05 MED ORDER — OXYMORPHONE HCL ER 20 MG PO TB12: 20.0000 mg | ORAL_TABLET | Freq: Two times a day (BID) | ORAL | Status: DC

## 2015-05-05 NOTE — Progress Notes (Signed)
Pre visit review using our clinic review tool, if applicable. No additional management support is needed unless otherwise documented below in the visit note. 

## 2015-05-05 NOTE — Progress Notes (Signed)
   Subjective:    Patient ID: Calvin Turner, male    DOB: [birth year], [age] y.o.   MRN: [id number]  HPI Here to follow up. He is doing well except for the chronic neck and back pain. His BP is stable.    Review of Systems  Constitutional: Negative.   Respiratory: Negative.   Cardiovascular: Negative.   Musculoskeletal: Positive for back pain and neck pain.       Objective:   Physical Exam  Constitutional: He is oriented to person, place, and time. He appears well-developed and well-nourished.  Cardiovascular: Normal rate, regular rhythm, normal heart sounds and intact distal pulses.   Pulmonary/Chest: Effort normal and breath sounds normal.  Neurological: He is alert and oriented to person, place, and time.          Assessment & Plan:  He is doing well. Meds were refilled. HTN is stable

## 2015-05-31 ENCOUNTER — Other Ambulatory Visit: Payer: Self-pay | Admitting: Family Medicine

## 2015-07-09 ENCOUNTER — Other Ambulatory Visit: Payer: Self-pay | Admitting: Family Medicine

## 2015-07-10 NOTE — Telephone Encounter (Signed)
Call in #30 with 5 rf 

## 2015-07-20 ENCOUNTER — Encounter: Payer: Self-pay | Admitting: Family Medicine

## 2015-07-21 MED ORDER — HYDROMORPHONE HCL 8 MG PO TABS: 8.0000 mg | ORAL_TABLET | Freq: Two times a day (BID) | ORAL | Status: DC | PRN

## 2015-07-21 NOTE — Telephone Encounter (Signed)
Try hydromorphone instead

## 2015-08-24 ENCOUNTER — Other Ambulatory Visit: Payer: Self-pay | Admitting: Family Medicine

## 2015-08-25 MED ORDER — HYDROMORPHONE HCL 8 MG PO TABS: 8.0000 mg | ORAL_TABLET | Freq: Two times a day (BID) | ORAL | Status: DC | PRN

## 2015-08-25 NOTE — Addendum Note (Signed)
Addended by: Gershon Crane A on: 08/25/2015 03:56 PM   Modules accepted: Orders

## 2015-08-25 NOTE — Telephone Encounter (Signed)
done

## 2015-08-26 NOTE — Telephone Encounter (Signed)
Left a message for pt that rx is ready for pick up.  

## 2015-08-28 ENCOUNTER — Ambulatory Visit (INDEPENDENT_AMBULATORY_CARE_PROVIDER_SITE_OTHER): Payer: 59 | Admitting: Family Medicine

## 2015-08-28 ENCOUNTER — Encounter: Payer: Self-pay | Admitting: Family Medicine

## 2015-08-28 VITALS — BP 113/72 | HR 69 | Temp 98.2°F | Ht 73.0 in | Wt 215.0 lb

## 2015-08-28 DIAGNOSIS — Z23 Encounter for immunization: Secondary | ICD-10-CM

## 2015-08-28 DIAGNOSIS — I10 Essential (primary) hypertension: Secondary | ICD-10-CM

## 2015-08-28 DIAGNOSIS — F329 Major depressive disorder, single episode, unspecified: Secondary | ICD-10-CM

## 2015-08-28 DIAGNOSIS — M542 Cervicalgia: Secondary | ICD-10-CM

## 2015-08-28 DIAGNOSIS — K219 Gastro-esophageal reflux disease without esophagitis: Secondary | ICD-10-CM | POA: Diagnosis not present

## 2015-08-28 DIAGNOSIS — E785 Hyperlipidemia, unspecified: Secondary | ICD-10-CM | POA: Diagnosis not present

## 2015-08-28 DIAGNOSIS — F32A Depression, unspecified: Secondary | ICD-10-CM

## 2015-08-28 LAB — BASIC METABOLIC PANEL
BUN: 15 mg/dL (ref 6–23)
CALCIUM: 9.4 mg/dL (ref 8.4–10.5)
CO2: 29 meq/L (ref 19–32)
CREATININE: 0.88 mg/dL (ref 0.40–1.50)
Chloride: 103 mEq/L (ref 96–112)
GFR: 95.98 mL/min (ref >60.00)
GLUCOSE: 88 mg/dL (ref 70–99)
Potassium: 4.2 mEq/L (ref 3.5–5.1)
Sodium: 138 mEq/L (ref 135–145)

## 2015-08-28 LAB — POCT URINALYSIS DIPSTICK
Bilirubin, UA: NEGATIVE
GLUCOSE UA: NEGATIVE
Ketones, UA: NEGATIVE
NITRITE UA: NEGATIVE
Protein, UA: NEGATIVE
Spec Grav, UA: 1.015
UROBILINOGEN UA: 0.2
pH, UA: 6.5

## 2015-08-28 LAB — CBC WITH DIFFERENTIAL/PLATELET
BASOS ABS: 0 10*3/uL (ref 0.0–0.1)
Basophils Relative: 0.4 % (ref 0.0–3.0)
EOS ABS: 0.3 10*3/uL (ref 0.0–0.7)
Eosinophils Relative: 4.3 % (ref 0.0–5.0)
HEMATOCRIT: 38.2 % — AB (ref 39.0–52.0)
HEMOGLOBIN: 12.8 g/dL — AB (ref 13.0–17.0)
LYMPHS PCT: 38.1 % (ref 12.0–46.0)
Lymphs Abs: 3 10*3/uL (ref 0.7–4.0)
MCHC: 33.5 g/dL (ref 30.0–36.0)
MCV: 88.5 fl (ref 78.0–100.0)
Monocytes Absolute: 0.8 10*3/uL (ref 0.1–1.0)
Monocytes Relative: 9.9 % (ref 3.0–12.0)
NEUTROS ABS: 3.7 10*3/uL (ref 1.4–7.7)
Neutrophils Relative %: 47.3 % (ref 43.0–77.0)
PLATELETS: 286 10*3/uL (ref 150.0–400.0)
RBC: 4.31 Mil/uL (ref 4.22–5.81)
RDW: 13.3 % (ref 11.5–15.5)
WBC: 7.9 10*3/uL (ref 4.0–10.5)

## 2015-08-28 LAB — HEPATIC FUNCTION PANEL
ALBUMIN: 4 g/dL (ref 3.5–5.2)
ALT: 30 U/L (ref 0–53)
AST: 16 U/L (ref 0–37)
Alkaline Phosphatase: 61 U/L (ref 39–117)
Bilirubin, Direct: 0.1 mg/dL (ref 0.0–0.3)
TOTAL PROTEIN: 7.2 g/dL (ref 6.0–8.3)
Total Bilirubin: 0.5 mg/dL (ref 0.2–1.2)

## 2015-08-28 LAB — TSH: TSH: 1.58 u[IU]/mL (ref 0.35–4.50)

## 2015-08-28 LAB — LIPID PANEL
Cholesterol: 235 mg/dL — ABNORMAL HIGH (ref 0–200)
HDL: 51.3 mg/dL (ref >39.00)
NonHDL: 183.62
TRIGLYCERIDES: 220 mg/dL — AB (ref 0.0–149.0)
Total CHOL/HDL Ratio: 5
VLDL: 44 mg/dL — AB (ref 0.0–40.0)

## 2015-08-28 LAB — LDL CHOLESTEROL, DIRECT: LDL DIRECT: 164 mg/dL

## 2015-08-28 MED ORDER — OMEPRAZOLE 20 MG PO CPDR: 20.0000 mg | DELAYED_RELEASE_CAPSULE | Freq: Two times a day (BID) | ORAL | Status: AC

## 2015-08-28 MED ORDER — LISINOPRIL 10 MG PO TABS: ORAL_TABLET | ORAL | Status: AC

## 2015-08-28 MED ORDER — BUPROPION HCL ER (XL) 150 MG PO TB24: 150.0000 mg | ORAL_TABLET | Freq: Every day | ORAL | Status: DC

## 2015-08-28 NOTE — Progress Notes (Signed)
Pre visit review using our clinic review tool, if applicable. No additional management support is needed unless otherwise documented below in the visit note. 

## 2015-08-31 ENCOUNTER — Encounter: Payer: Self-pay | Admitting: Family Medicine

## 2015-08-31 DIAGNOSIS — F329 Major depressive disorder, single episode, unspecified: Secondary | ICD-10-CM | POA: Insufficient documentation

## 2015-08-31 DIAGNOSIS — F32A Depression, unspecified: Secondary | ICD-10-CM | POA: Insufficient documentation

## 2015-08-31 NOTE — Progress Notes (Signed)
   Subjective:    Patient ID: Calvin Turner, male    DOB: 07-Jul-1961, 54 y.o.   MRN: [id number]  HPI Here to follow up on HTN, chronic neck pain, and GERD. These have been stable and he needs med refills. His wife says he has been depressed, and Yacoub agrees. He says he often feels sad and worries about things. His sleep and appetite are preserved. No suicidal thoughts. He feels unmotivated and he hates being disabled and not able to work for a living.    Review of Systems  Constitutional: Negative.   Respiratory: Negative.   Cardiovascular: Negative.   Gastrointestinal: Negative.   Neurological: Negative.   Psychiatric/Behavioral: Positive for dysphoric mood. Negative for suicidal ideas, hallucinations, behavioral problems, confusion, sleep disturbance, self-injury, decreased concentration and agitation. The patient is not nervous/anxious and is not hyperactive.        Objective:   Physical Exam  Constitutional: He is oriented to person, place, and time. He appears well-developed and well-nourished.  Neck: No thyromegaly present.  Cardiovascular: Normal rate, regular rhythm, normal heart sounds and intact distal pulses.   Pulmonary/Chest: Effort normal and breath sounds normal.  Lymphadenopathy:    He has no cervical adenopathy.  Neurological: He is alert and oriented to person, place, and time.  Psychiatric: He has a normal mood and affect. His behavior is normal. Judgment and thought content normal.          Assessment & Plan:  He does seem to be depressed so we will start him on Wellbutrin XL 150 mg daily. Other meds are refilled. His HTN is stable. For the lipids, he is fasting so we will get labs drawn today. His neck pain and GERD are stable. Recheck in 3-4 weeks.

## 2015-09-02 MED ORDER — CIPROFLOXACIN HCL 500 MG PO TABS: 500.0000 mg | ORAL_TABLET | Freq: Two times a day (BID) | ORAL | Status: DC

## 2015-09-02 NOTE — Addendum Note (Signed)
Addended by: Aniceto Boss A on: 09/02/2015 02:09 PM   Modules accepted: Orders

## 2015-09-08 ENCOUNTER — Encounter: Payer: Self-pay | Admitting: Family Medicine

## 2015-09-08 MED ORDER — SULFAMETHOXAZOLE-TRIMETHOPRIM 800-160 MG PO TABS: 1.0000 | ORAL_TABLET | Freq: Two times a day (BID) | ORAL | Status: AC

## 2015-09-08 NOTE — Telephone Encounter (Signed)
Stop the Cipro and call in Bactrim DS bid for 10 days

## 2015-09-14 ENCOUNTER — Other Ambulatory Visit: Payer: Self-pay | Admitting: Family Medicine

## 2015-09-25 ENCOUNTER — Telehealth: Payer: Self-pay | Admitting: Family Medicine

## 2015-09-25 MED ORDER — HYDROMORPHONE HCL 8 MG PO TABS: 8.0000 mg | ORAL_TABLET | Freq: Two times a day (BID) | ORAL | Status: DC | PRN

## 2015-09-25 NOTE — Telephone Encounter (Signed)
done

## 2015-09-28 ENCOUNTER — Encounter: Payer: Self-pay | Admitting: Family Medicine

## 2015-09-28 DIAGNOSIS — M25559 Pain in unspecified hip: Secondary | ICD-10-CM

## 2015-09-29 MED ORDER — BACLOFEN 20 MG PO TABS: 20.0000 mg | ORAL_TABLET | Freq: Three times a day (TID) | ORAL | Status: DC

## 2015-09-29 NOTE — Telephone Encounter (Signed)
Baclofen was refilled. Referral to Ortho was done

## 2015-10-27 ENCOUNTER — Telehealth: Payer: Self-pay | Admitting: Family Medicine

## 2015-10-28 MED ORDER — HYDROMORPHONE HCL 8 MG PO TABS: 8.0000 mg | ORAL_TABLET | Freq: Two times a day (BID) | ORAL | Status: DC | PRN

## 2015-10-28 NOTE — Telephone Encounter (Signed)
done

## 2015-10-28 NOTE — Telephone Encounter (Signed)
Script is ready for pick up, sent a my chart message to pt.

## 2015-10-28 NOTE — Addendum Note (Signed)
Addended by: Gershon CraneFRY, Emberlynn Riggan A on: 10/28/2015 12:55 PM   Modules accepted: Orders

## 2015-11-24 ENCOUNTER — Telehealth: Payer: Self-pay | Admitting: Family Medicine

## 2015-11-25 MED ORDER — HYDROMORPHONE HCL 8 MG PO TABS: 8.0000 mg | ORAL_TABLET | Freq: Three times a day (TID) | ORAL | Status: DC | PRN

## 2015-11-25 NOTE — Addendum Note (Signed)
Addended by: Gershon CraneFRY, STEPHEN A on: 11/25/2015 12:45 PM   Modules accepted: Orders

## 2015-11-25 NOTE — Telephone Encounter (Signed)
Done ( I wrote for 3 a day)

## 2015-12-25 ENCOUNTER — Telehealth: Payer: Self-pay | Admitting: Family Medicine

## 2015-12-25 ENCOUNTER — Other Ambulatory Visit: Payer: Self-pay | Admitting: Family Medicine

## 2015-12-25 MED ORDER — HYDROMORPHONE HCL 8 MG PO TABS: 8.0000 mg | ORAL_TABLET | Freq: Three times a day (TID) | ORAL | Status: DC | PRN

## 2015-12-25 MED ORDER — TEMAZEPAM 30 MG PO CAPS: 30.0000 mg | ORAL_CAPSULE | Freq: Every evening | ORAL | Status: DC | PRN

## 2015-12-25 NOTE — Telephone Encounter (Signed)
Call in #30 with 5 rf 

## 2015-12-25 NOTE — Telephone Encounter (Signed)
Pt last seen 9.9.2016. Rx last filled on 7.22.2016 #30 with 5 rf.  Pls advise.

## 2015-12-25 NOTE — Addendum Note (Signed)
Addended by: Gershon CraneFRY, Waneda Klammer A on: 12/25/2015 04:54 PM   Modules accepted: Orders

## 2015-12-25 NOTE — Telephone Encounter (Signed)
rx ready to pick up. Also call in Temazepam #30 with 5 rf

## 2015-12-25 NOTE — Addendum Note (Signed)
Addended by: Aniceto BossNIMMONS, SYLVIA A on: 12/25/2015 05:00 PM   Modules accepted: Orders

## 2016-01-25 ENCOUNTER — Telehealth: Payer: Self-pay | Admitting: Family Medicine

## 2016-01-26 MED ORDER — HYDROMORPHONE HCL 8 MG PO TABS: 8.0000 mg | ORAL_TABLET | Freq: Three times a day (TID) | ORAL | Status: DC | PRN

## 2016-01-26 NOTE — Addendum Note (Signed)
Addended by: Gershon Crane A on: 01/26/2016 12:58 PM   Modules accepted: Orders

## 2016-01-26 NOTE — Telephone Encounter (Signed)
done

## 2016-02-01 ENCOUNTER — Other Ambulatory Visit: Payer: Self-pay | Admitting: Family Medicine

## 2016-02-23 ENCOUNTER — Telehealth: Payer: Self-pay | Admitting: Family Medicine

## 2016-02-24 MED ORDER — HYDROMORPHONE HCL 8 MG PO TABS: 8.0000 mg | ORAL_TABLET | Freq: Three times a day (TID) | ORAL | Status: DC | PRN

## 2016-02-24 NOTE — Telephone Encounter (Signed)
done

## 2016-02-24 NOTE — Addendum Note (Signed)
Addended by: Gershon CraneFRY, Verneda Hollopeter A on: 02/24/2016 08:43 AM   Modules accepted: Orders

## 2016-03-23 ENCOUNTER — Telehealth: Payer: Self-pay | Admitting: Family Medicine

## 2016-03-23 MED ORDER — HYDROMORPHONE HCL 8 MG PO TABS: 8.0000 mg | ORAL_TABLET | Freq: Three times a day (TID) | ORAL | Status: DC | PRN

## 2016-03-23 NOTE — Telephone Encounter (Signed)
done

## 2016-03-23 NOTE — Telephone Encounter (Signed)
Script is ready for pick up and I sent pt a my chart message.  

## 2016-04-19 ENCOUNTER — Telehealth: Payer: Self-pay | Admitting: Family Medicine

## 2016-04-19 MED ORDER — HYDROMORPHONE HCL 8 MG PO TABS: 8.0000 mg | ORAL_TABLET | Freq: Three times a day (TID) | ORAL | Status: DC | PRN

## 2016-04-19 NOTE — Telephone Encounter (Signed)
done

## 2016-05-19 ENCOUNTER — Telehealth: Payer: Self-pay | Admitting: Family Medicine

## 2016-05-20 MED ORDER — HYDROMORPHONE HCL 8 MG PO TABS: 8.0000 mg | ORAL_TABLET | Freq: Three times a day (TID) | ORAL | Status: AC | PRN

## 2016-05-20 NOTE — Addendum Note (Signed)
Addended by: Gershon CraneFRY, STEPHEN A on: 05/20/2016 05:00 PM   Modules accepted: Orders

## 2016-05-20 NOTE — Telephone Encounter (Signed)
done

## 2016-05-23 ENCOUNTER — Other Ambulatory Visit: Payer: Self-pay | Admitting: Family Medicine

## 2016-05-23 NOTE — Telephone Encounter (Signed)
LVM to return our call, Rx ready for pick up  

## 2016-06-19 ENCOUNTER — Other Ambulatory Visit: Payer: Self-pay | Admitting: Family Medicine

## 2016-06-19 ENCOUNTER — Encounter: Payer: Self-pay | Admitting: Family Medicine

## 2016-06-22 NOTE — Telephone Encounter (Signed)
Call in #30 with 5 rf 

## 2017-09-07 ENCOUNTER — Encounter: Payer: Self-pay | Admitting: Family Medicine

## 2025-01-17 ENCOUNTER — Other Ambulatory Visit (HOSPITAL_BASED_OUTPATIENT_CLINIC_OR_DEPARTMENT_OTHER): Payer: Self-pay | Admitting: Physician Assistant

## 2025-01-17 DIAGNOSIS — Z87891 Personal history of nicotine dependence: Secondary | ICD-10-CM
